# Patient Record
Sex: Male | Born: 2005 | Hispanic: No | Marital: Single
Health system: Southern US, Community
[De-identification: ages and names within clinical notes are randomized; demographics above are authoritative.]

## PROBLEM LIST (undated history)

## (undated) DIAGNOSIS — T7840XA Allergy, unspecified, initial encounter: Secondary | ICD-10-CM

## (undated) HISTORY — DX: Allergy, unspecified, initial encounter: T78.40XA

---
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
## 2023-05-28 DIAGNOSIS — K760 Fatty (change of) liver, not elsewhere classified: Secondary | ICD-10-CM | POA: Diagnosis not present

## 2023-11-21 DIAGNOSIS — Z1339 Encounter for screening examination for other mental health and behavioral disorders: Secondary | ICD-10-CM | POA: Diagnosis not present

## 2023-11-21 DIAGNOSIS — F909 Attention-deficit hyperactivity disorder, unspecified type: Secondary | ICD-10-CM | POA: Diagnosis not present

## 2023-12-14 DIAGNOSIS — R5383 Other fatigue: Secondary | ICD-10-CM | POA: Diagnosis not present

## 2023-12-14 DIAGNOSIS — Z79899 Other long term (current) drug therapy: Secondary | ICD-10-CM | POA: Diagnosis not present

## 2023-12-14 DIAGNOSIS — E559 Vitamin D deficiency, unspecified: Secondary | ICD-10-CM | POA: Diagnosis not present

## 2023-12-18 DIAGNOSIS — Z79899 Other long term (current) drug therapy: Secondary | ICD-10-CM | POA: Diagnosis not present

## 2024-07-11 DIAGNOSIS — Z136 Encounter for screening for cardiovascular disorders: Secondary | ICD-10-CM | POA: Diagnosis not present

## 2024-07-11 DIAGNOSIS — R0602 Shortness of breath: Secondary | ICD-10-CM | POA: Diagnosis not present

## 2024-07-11 DIAGNOSIS — E559 Vitamin D deficiency, unspecified: Secondary | ICD-10-CM | POA: Diagnosis not present

## 2024-07-11 DIAGNOSIS — R5383 Other fatigue: Secondary | ICD-10-CM | POA: Diagnosis not present

## 2024-07-11 DIAGNOSIS — F909 Attention-deficit hyperactivity disorder, unspecified type: Secondary | ICD-10-CM | POA: Diagnosis not present

## 2024-07-11 DIAGNOSIS — Z1331 Encounter for screening for depression: Secondary | ICD-10-CM | POA: Diagnosis not present

## 2024-07-11 DIAGNOSIS — Z79899 Other long term (current) drug therapy: Secondary | ICD-10-CM | POA: Diagnosis not present

## 2024-07-11 DIAGNOSIS — Z1339 Encounter for screening examination for other mental health and behavioral disorders: Secondary | ICD-10-CM | POA: Diagnosis not present

## 2024-07-11 DIAGNOSIS — Z0000 Encounter for general adult medical examination without abnormal findings: Secondary | ICD-10-CM | POA: Diagnosis not present

## 2024-07-11 DIAGNOSIS — K760 Fatty (change of) liver, not elsewhere classified: Secondary | ICD-10-CM | POA: Diagnosis not present

## 2024-08-08 DIAGNOSIS — E559 Vitamin D deficiency, unspecified: Secondary | ICD-10-CM | POA: Diagnosis not present

## 2024-08-08 DIAGNOSIS — E789 Disorder of lipoprotein metabolism, unspecified: Secondary | ICD-10-CM | POA: Diagnosis not present

## 2024-09-19 DIAGNOSIS — E789 Disorder of lipoprotein metabolism, unspecified: Secondary | ICD-10-CM | POA: Diagnosis not present

## 2024-09-19 DIAGNOSIS — F909 Attention-deficit hyperactivity disorder, unspecified type: Secondary | ICD-10-CM | POA: Diagnosis not present

## 2024-09-19 DIAGNOSIS — Z79899 Other long term (current) drug therapy: Secondary | ICD-10-CM | POA: Diagnosis not present

## 2024-09-22 DIAGNOSIS — Z79899 Other long term (current) drug therapy: Secondary | ICD-10-CM | POA: Diagnosis not present

## 2024-10-31 DIAGNOSIS — Z79899 Other long term (current) drug therapy: Secondary | ICD-10-CM | POA: Diagnosis not present

## 2024-10-31 DIAGNOSIS — F909 Attention-deficit hyperactivity disorder, unspecified type: Secondary | ICD-10-CM | POA: Diagnosis not present

## 2024-10-31 DIAGNOSIS — Z6820 Body mass index (BMI) 20.0-20.9, adult: Secondary | ICD-10-CM | POA: Diagnosis not present

## 2024-10-31 DIAGNOSIS — E789 Disorder of lipoprotein metabolism, unspecified: Secondary | ICD-10-CM | POA: Diagnosis not present

## 2024-11-04 DIAGNOSIS — Z79899 Other long term (current) drug therapy: Secondary | ICD-10-CM | POA: Diagnosis not present

## 2024-11-29 DIAGNOSIS — F909 Attention-deficit hyperactivity disorder, unspecified type: Secondary | ICD-10-CM | POA: Diagnosis not present

## 2024-11-29 DIAGNOSIS — Z681 Body mass index (BMI) 19 or less, adult: Secondary | ICD-10-CM | POA: Diagnosis not present

## 2024-11-29 DIAGNOSIS — Z79899 Other long term (current) drug therapy: Secondary | ICD-10-CM | POA: Diagnosis not present

## 2024-11-29 DIAGNOSIS — E789 Disorder of lipoprotein metabolism, unspecified: Secondary | ICD-10-CM | POA: Diagnosis not present
